# Patient Record
Sex: Female | Born: 1970 | Race: Black or African American | Hispanic: No | Marital: Married | Smoking: Never smoker
Health system: Southern US, Community
[De-identification: ages and names within clinical notes are randomized; demographics above are authoritative.]

---

## 2013-11-29 ENCOUNTER — Encounter (HOSPITAL_BASED_OUTPATIENT_CLINIC_OR_DEPARTMENT_OTHER): Payer: Self-pay | Admitting: Emergency Medicine

## 2013-11-29 ENCOUNTER — Emergency Department (HOSPITAL_BASED_OUTPATIENT_CLINIC_OR_DEPARTMENT_OTHER)
Admission: EM | Admit: 2013-11-29 | Discharge: 2013-11-30 | Disposition: A | Discharge status: Home / Self Care | Payer: 59 | Attending: Emergency Medicine | Admitting: Emergency Medicine

## 2013-11-29 DIAGNOSIS — Z3202 Encounter for pregnancy test, result negative: Secondary | ICD-10-CM | POA: Insufficient documentation

## 2013-11-29 DIAGNOSIS — B9789 Other viral agents as the cause of diseases classified elsewhere: Secondary | ICD-10-CM | POA: Insufficient documentation

## 2013-11-29 DIAGNOSIS — B349 Viral infection, unspecified: Secondary | ICD-10-CM

## 2013-11-29 LAB — URINALYSIS, ROUTINE W REFLEX MICROSCOPIC
BILIRUBIN URINE: NEGATIVE
Glucose, UA: NEGATIVE mg/dL
Hgb urine dipstick: NEGATIVE
Ketones, ur: 40 mg/dL — AB
NITRITE: NEGATIVE
Protein, ur: NEGATIVE mg/dL
SPECIFIC GRAVITY, URINE: 1.018 (ref 1.005–1.030)
UROBILINOGEN UA: 4 mg/dL — AB (ref 0.0–1.0)
pH: 8.5 — ABNORMAL HIGH (ref 5.0–8.0)

## 2013-11-29 LAB — URINE MICROSCOPIC-ADD ON

## 2013-11-29 LAB — PREGNANCY, URINE: Preg Test, Ur: NEGATIVE

## 2013-11-29 NOTE — ED Notes (Signed)
Abdominal pain and nausea. Leg pain.

## 2013-11-29 NOTE — ED Notes (Signed)
Slight ha x 1 day,  Lower abd pain radiating down left thigh down to knee,  nausea

## 2013-11-30 LAB — CBC WITH DIFFERENTIAL/PLATELET
BASOS PCT: 0 % (ref 0–1)
Basophils Absolute: 0 10*3/uL (ref 0.0–0.1)
Eosinophils Absolute: 0 10*3/uL (ref 0.0–0.7)
Eosinophils Relative: 0 % (ref 0–5)
HCT: 37.4 % (ref 36.0–46.0)
HEMOGLOBIN: 12.5 g/dL (ref 12.0–15.0)
LYMPHS ABS: 0.3 10*3/uL — AB (ref 0.7–4.0)
LYMPHS PCT: 5 % — AB (ref 12–46)
MCH: 34.4 pg — ABNORMAL HIGH (ref 26.0–34.0)
MCHC: 33.4 g/dL (ref 30.0–36.0)
MCV: 103 fL — ABNORMAL HIGH (ref 78.0–100.0)
MONOS PCT: 7 % (ref 3–12)
Monocytes Absolute: 0.5 10*3/uL (ref 0.1–1.0)
NEUTROS ABS: 5.9 10*3/uL (ref 1.7–7.7)
NEUTROS PCT: 88 % — AB (ref 43–77)
Platelets: 289 10*3/uL (ref 150–400)
RBC: 3.63 MIL/uL — ABNORMAL LOW (ref 3.87–5.11)
RDW: 12.1 % (ref 11.5–15.5)
WBC: 6.7 10*3/uL (ref 4.0–10.5)

## 2013-11-30 LAB — BASIC METABOLIC PANEL
BUN: 5 mg/dL — ABNORMAL LOW (ref 6–23)
CO2: 24 mEq/L (ref 19–32)
Calcium: 9.2 mg/dL (ref 8.4–10.5)
Chloride: 102 mEq/L (ref 96–112)
Creatinine, Ser: 0.7 mg/dL (ref 0.50–1.10)
Glucose, Bld: 120 mg/dL — ABNORMAL HIGH (ref 70–99)
POTASSIUM: 3.7 meq/L (ref 3.7–5.3)
SODIUM: 140 meq/L (ref 137–147)

## 2013-11-30 LAB — HEPATIC FUNCTION PANEL
ALBUMIN: 4 g/dL (ref 3.5–5.2)
ALK PHOS: 80 U/L (ref 39–117)
ALT: 15 U/L (ref 0–35)
AST: 26 U/L (ref 0–37)
BILIRUBIN TOTAL: 0.6 mg/dL (ref 0.3–1.2)
Bilirubin, Direct: 0.2 mg/dL (ref 0.0–0.3)
Indirect Bilirubin: 0.4 mg/dL (ref 0.3–0.9)
Total Protein: 7.3 g/dL (ref 6.0–8.3)

## 2013-11-30 LAB — CK: Total CK: 76 U/L (ref 7–177)

## 2013-11-30 MED ORDER — KETOROLAC TROMETHAMINE 30 MG/ML IJ SOLN
30.0000 mg | Freq: Once | INTRAMUSCULAR | Status: AC
  Administered 2013-11-30: 30 mg via INTRAVENOUS
  Filled 2013-11-30: qty 2
  Filled 2013-11-30: qty 1

## 2013-11-30 MED ORDER — SODIUM CHLORIDE 0.9 % IV BOLUS (SEPSIS)
1000.0000 mL | Freq: Once | INTRAVENOUS | Status: AC
  Administered 2013-11-30: 1000 mL via INTRAVENOUS

## 2013-11-30 MED ORDER — ONDANSETRON HCL 4 MG/2ML IJ SOLN
4.0000 mg | Freq: Once | INTRAMUSCULAR | Status: AC
  Administered 2013-11-30: 4 mg via INTRAVENOUS
  Filled 2013-11-30: qty 2

## 2013-11-30 NOTE — ED Provider Notes (Signed)
CSN: [id number]     Arrival date & time 11/29/13  2229 History   First MD Initiated Contact with Patient 11/30/13 0022     Chief Complaint  Patient presents with  . Feels Bad      (Consider location/radiation/quality/duration/timing/severity/associated sxs/prior Treatment) HPI This is a 43 year old female who states she has "felt bad" since yesterday morning. The onset was gradual. By feeling bad she describes this as general malaise, myalgias particularly of the thighs and knees, nausea and a vague discomfort in her abdomen. She was noted to have a low-grade fever on arrival. She has not vomited or had diarrhea. She denies respiratory symptoms including chest pain, cough or shortness of breath. Her abdomen is not tender.  History reviewed. No pertinent past medical history. History reviewed. No pertinent past surgical history. No family history on file. History  Substance Use Topics  . Smoking status: Never Smoker   . Smokeless tobacco: Not on file  . Alcohol Use: No   OB History   Grav Para Term Preterm Abortions TAB SAB Ect Mult Living                 Review of Systems  All other systems reviewed and are negative.    Allergies  Review of patient's allergies indicates no known allergies.  Home Medications  No current outpatient prescriptions on file. BP 115/70  Pulse 115  Temp(Src) 99.1 F (37.3 C) (Oral)  Resp 20  Ht 5\' 2"  (1.575 m)  Wt 118 lb (53.524 kg)  BMI 21.58 kg/m2  SpO2 96%  LMP 11/13/2013  Physical Exam General: Well-developed, well-nourished female in no acute distress; appearance consistent with age of record HENT: normocephalic; atraumatic Eyes: pupils equal, round and reactive to light; extraocular muscles intact; no scleral icterus Neck: supple Heart: regular rate and rhythm; no murmurs, rubs or gallops Lungs: clear to auscultation bilaterally Abdomen: soft; nondistended; nontender; no masses or hepatosplenomegaly; bowel sounds  present Extremities: No deformity; full range of motion; pulses normal; mild tenderness of the distal quadriceps Neurologic: Awake, alert and oriented; motor function intact in all extremities and symmetric; no facial droop Skin: Warm and dry Psychiatric: Flat affect    ED Course  Procedures (including critical care time)   MDM   Nursing notes and vitals signs, including pulse oximetry, reviewed.  Summary of this visit's results, reviewed by myself:  Labs:  Results for orders placed during the hospital encounter of 11/29/13 (from the past 24 hour(s))  URINALYSIS, ROUTINE W REFLEX MICROSCOPIC     Status: Abnormal   Collection Time    11/29/13 10:30 PM      Result Value Ref Range   Color, Urine YELLOW  YELLOW   APPearance CLOUDY (*) CLEAR   Specific Gravity, Urine 1.018  1.005 - 1.030   pH 8.5 (*) 5.0 - 8.0   Glucose, UA NEGATIVE  NEGATIVE mg/dL   Hgb urine dipstick NEGATIVE  NEGATIVE   Bilirubin Urine NEGATIVE  NEGATIVE   Ketones, ur 40 (*) NEGATIVE mg/dL   Protein, ur NEGATIVE  NEGATIVE mg/dL   Urobilinogen, UA 4.0 (*) 0.0 - 1.0 mg/dL   Nitrite NEGATIVE  NEGATIVE   Leukocytes, UA TRACE (*) NEGATIVE  PREGNANCY, URINE     Status: None   Collection Time    11/29/13 10:30 PM      Result Value Ref Range   Preg Test, Ur NEGATIVE  NEGATIVE  URINE MICROSCOPIC-ADD ON     Status: Abnormal   Collection Time  11/29/13 10:30 PM      Result Value Ref Range   Squamous Epithelial / LPF MANY (*) RARE   WBC, UA 0-2  <3 WBC/hpf   RBC / HPF 0-2  <3 RBC/hpf   Bacteria, UA FEW (*) RARE   Urine-Other MUCOUS PRESENT    CK     Status: None   Collection Time    11/30/13 12:25 AM      Result Value Ref Range   Total CK 76  7 - 177 U/L  BASIC METABOLIC PANEL     Status: Abnormal   Collection Time    11/30/13 12:25 AM      Result Value Ref Range   Sodium 140  137 - 147 mEq/L   Potassium 3.7  3.7 - 5.3 mEq/L   Chloride 102  96 - 112 mEq/L   CO2 24  19 - 32 mEq/L   Glucose, Bld 120  (*) 70 - 99 mg/dL   BUN 5 (*) 6 - 23 mg/dL   Creatinine, Ser 0.70  0.50 - 1.10 mg/dL   Calcium 9.2  8.4 - 10.5 mg/dL   GFR calc non Af Amer >90  >90 mL/min   GFR calc Af Amer >90  >90 mL/min  CBC WITH DIFFERENTIAL     Status: Abnormal   Collection Time    11/30/13 12:25 AM      Result Value Ref Range   WBC 6.7  4.0 - 10.5 K/uL   RBC 3.63 (*) 3.87 - 5.11 MIL/uL   Hemoglobin 12.5  12.0 - 15.0 g/dL   HCT 37.4  36.0 - 46.0 %   MCV 103.0 (*) 78.0 - 100.0 fL   MCH 34.4 (*) 26.0 - 34.0 pg   MCHC 33.4  30.0 - 36.0 g/dL   RDW 12.1  11.5 - 15.5 %   Platelets 289  150 - 400 K/uL   Neutrophils Relative % 88 (*) 43 - 77 %   Neutro Abs 5.9  1.7 - 7.7 K/uL   Lymphocytes Relative 5 (*) 12 - 46 %   Lymphs Abs 0.3 (*) 0.7 - 4.0 K/uL   Monocytes Relative 7  3 - 12 %   Monocytes Absolute 0.5  0.1 - 1.0 K/uL   Eosinophils Relative 0  0 - 5 %   Eosinophils Absolute 0.0  0.0 - 0.7 K/uL   Basophils Relative 0  0 - 1 %   Basophils Absolute 0.0  0.0 - 0.1 K/uL  HEPATIC FUNCTION PANEL     Status: None   Collection Time    11/30/13 12:25 AM      Result Value Ref Range   Total Protein 7.3  6.0 - 8.3 g/dL   Albumin 4.0  3.5 - 5.2 g/dL   AST 26  0 - 37 U/L   ALT 15  0 - 35 U/L   Alkaline Phosphatase 80  39 - 117 U/L   Total Bilirubin 0.6  0.3 - 1.2 mg/dL   Bilirubin, Direct 0.2  0.0 - 0.3 mg/dL   Indirect Bilirubin 0.4  0.3 - 0.9 mg/dL   1:27 AM Feels better after IV fluids and medications. The symptoms are likely due to an acute viral illness.     Wynetta Fines, MD 11/30/13 [phone]

## 2014-08-07 ENCOUNTER — Other Ambulatory Visit: Payer: Self-pay | Admitting: Obstetrics and Gynecology

## 2014-08-07 DIAGNOSIS — N63 Unspecified lump in unspecified breast: Secondary | ICD-10-CM

## 2014-08-17 ENCOUNTER — Other Ambulatory Visit: Payer: Self-pay | Admitting: Obstetrics and Gynecology

## 2014-08-17 ENCOUNTER — Other Ambulatory Visit: Payer: Self-pay

## 2014-08-17 DIAGNOSIS — N63 Unspecified lump in unspecified breast: Secondary | ICD-10-CM

## 2014-08-22 ENCOUNTER — Ambulatory Visit
Admission: RE | Admit: 2014-08-22 | Discharge: 2014-08-22 | Disposition: A | Discharge status: Home / Self Care | Payer: 59 | Source: Ambulatory Visit | Attending: Obstetrics and Gynecology | Admitting: Obstetrics and Gynecology

## 2014-08-22 ENCOUNTER — Other Ambulatory Visit: Payer: Self-pay | Admitting: Obstetrics and Gynecology

## 2014-08-22 DIAGNOSIS — N63 Unspecified lump in unspecified breast: Secondary | ICD-10-CM

## 2014-08-28 ENCOUNTER — Other Ambulatory Visit: Payer: Self-pay | Admitting: Obstetrics and Gynecology

## 2014-08-28 DIAGNOSIS — N63 Unspecified lump in unspecified breast: Secondary | ICD-10-CM

## 2014-09-01 ENCOUNTER — Ambulatory Visit
Admission: RE | Admit: 2014-09-01 | Discharge: 2014-09-01 | Disposition: A | Discharge status: Home / Self Care | Payer: 59 | Source: Ambulatory Visit | Attending: Obstetrics and Gynecology | Admitting: Obstetrics and Gynecology

## 2014-09-01 DIAGNOSIS — N63 Unspecified lump in unspecified breast: Secondary | ICD-10-CM

## 2015-02-01 ENCOUNTER — Other Ambulatory Visit: Payer: Self-pay | Admitting: Obstetrics & Gynecology

## 2015-02-02 LAB — CYTOLOGY - PAP

## 2015-02-26 ENCOUNTER — Other Ambulatory Visit: Payer: Self-pay | Admitting: General Surgery

## 2015-02-26 DIAGNOSIS — D241 Benign neoplasm of right breast: Secondary | ICD-10-CM

## 2015-09-12 IMAGING — US US RT BREAST BX W LOC DEV 1ST LESION IMG BX SPEC US GUIDE
1 series · 12 of 12 positions shown · non-contrast
Comparison: Previous exams.

ADDENDUM:
Pathology revealed a ductal papilloma in the right breast, for which
excision is recommended. This was found to be concordant by Kaki. Neupane
Jorje. Pathology was discussed with the patient by telephone. She
reported doing well after the biopsy. Post biopsy instructions and
care were reviewed and her questions were answered. Surgical
consultation has been arranged with Kaki. Lilia Guadalupe Axel at [[email or web address]] on September 22, 2014.

Pathology results reported by Laty Sino RN, BSN on September 05, 2014.
CLINICAL DATA: 43-year-old female with indeterminate right breast
mass at 230
EXAM:
ULTRASOUND GUIDED RIGHT BREAST CORE NEEDLE BIOPSY

[Series 1: us right breast bx w loc dev 1st lesion img bx spe · 12 of 12 slices shown]
[im 1/12]
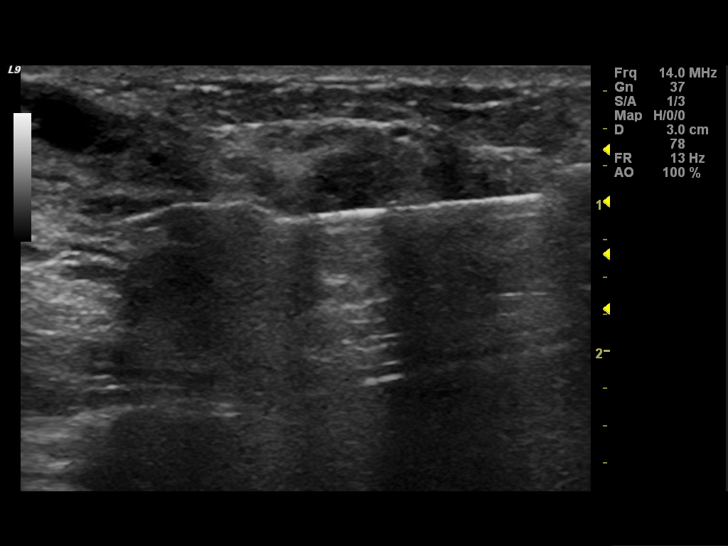
[im 2/12]
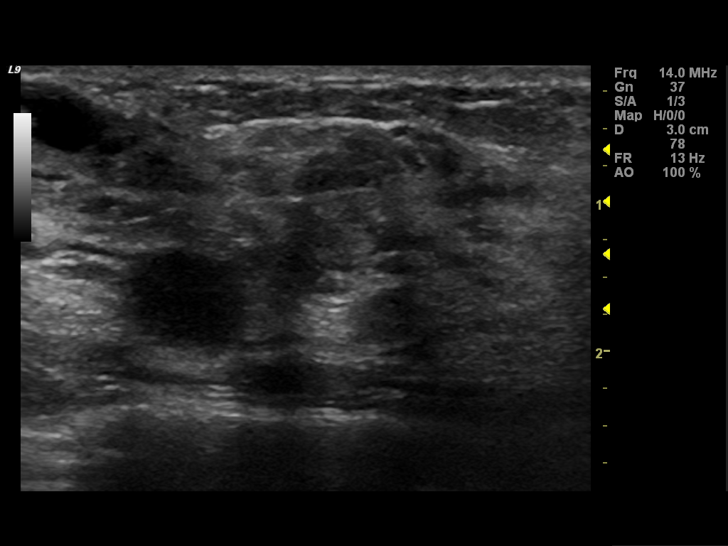
[im 3/12]
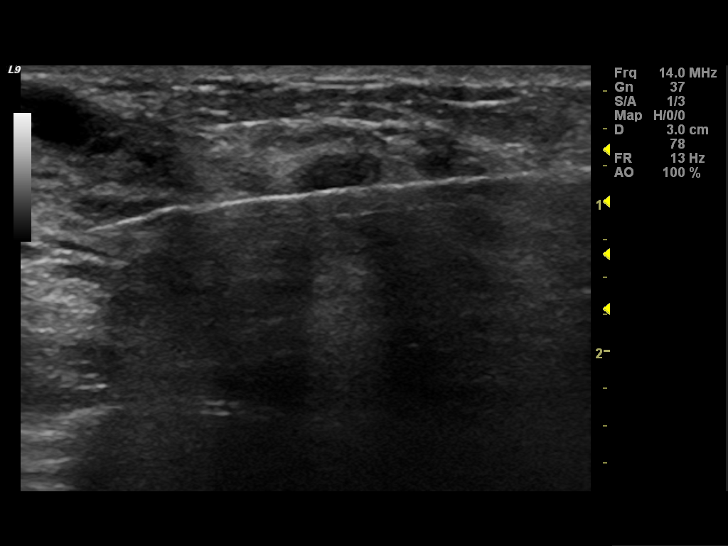
[im 4/12]
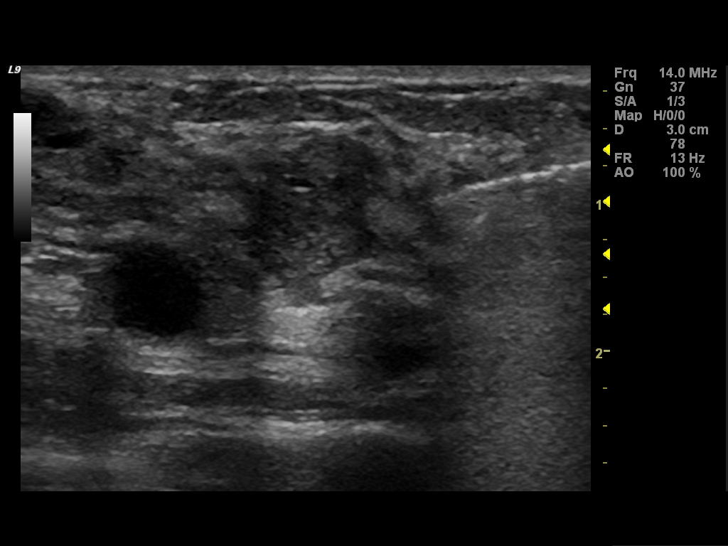
[im 5/12]
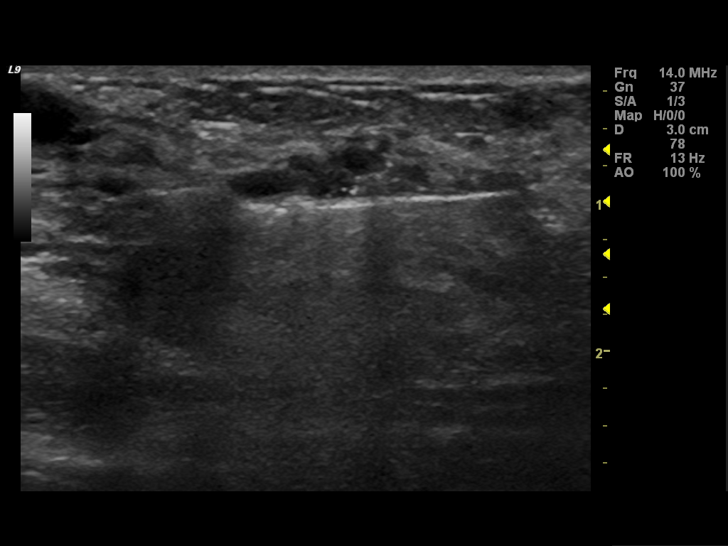
[im 6/12]
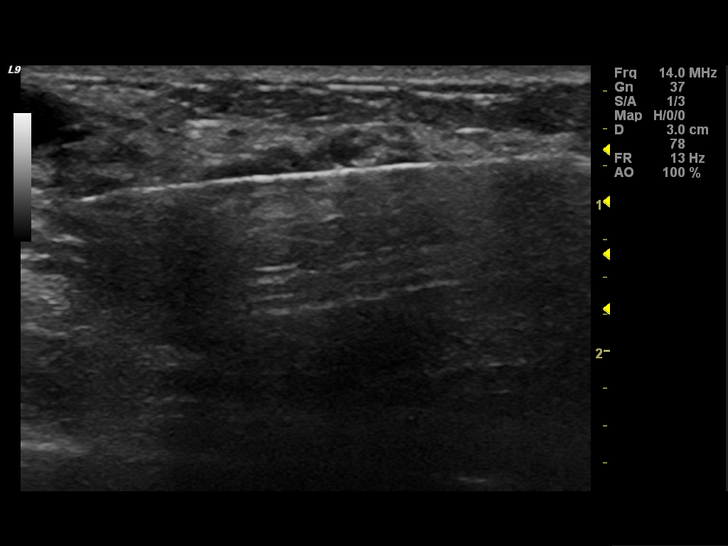
[im 7/12]
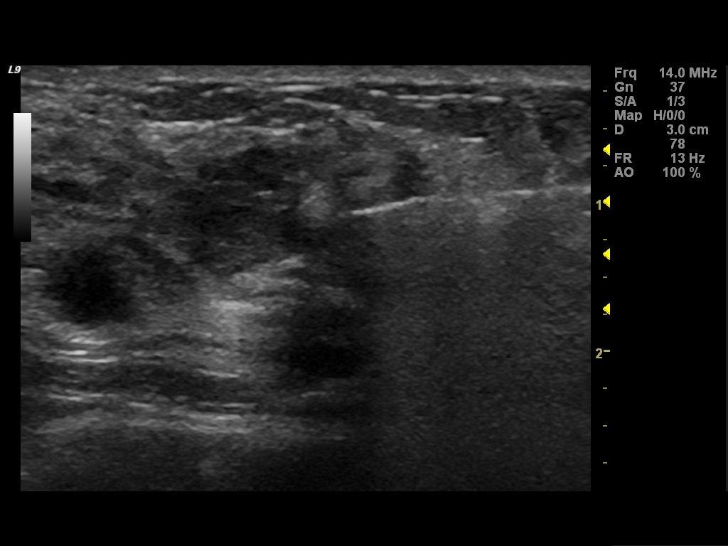
[im 8/12]
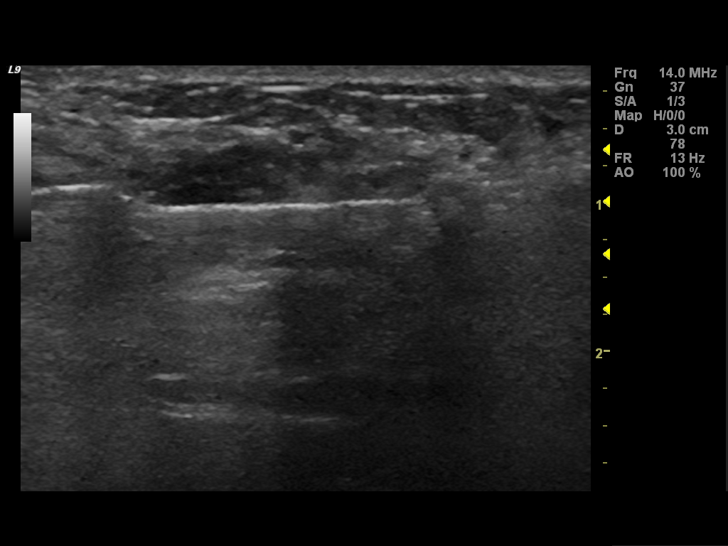
[im 9/12]
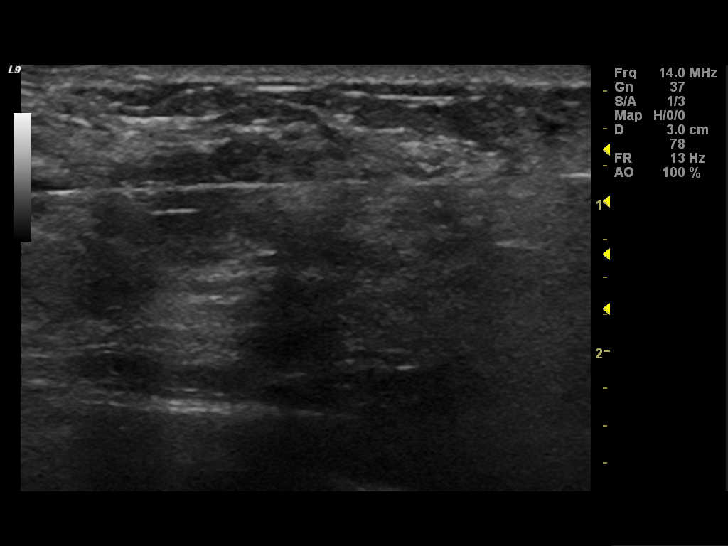
[im 10/12]
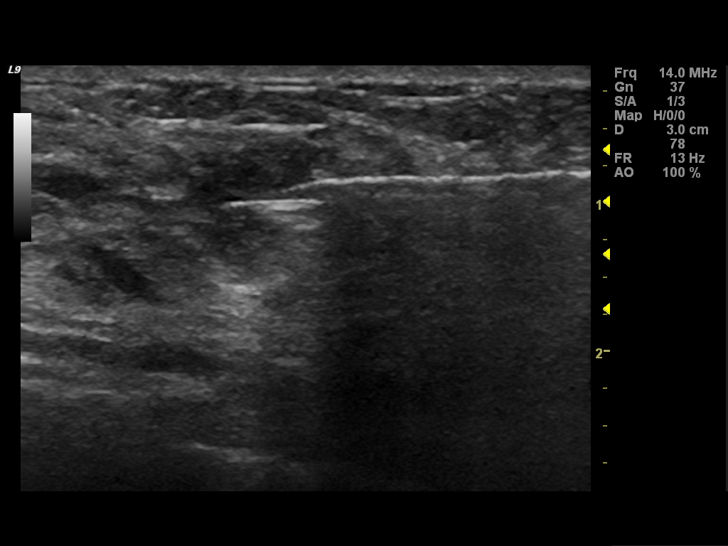
[im 11/12]
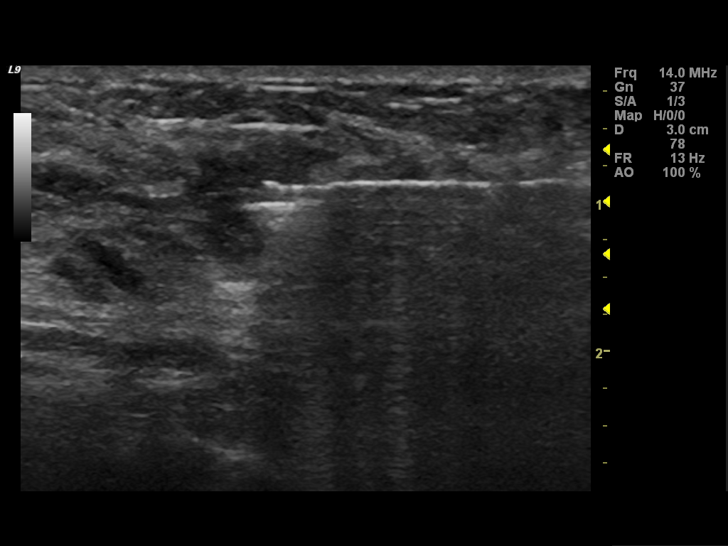
[im 12/12]
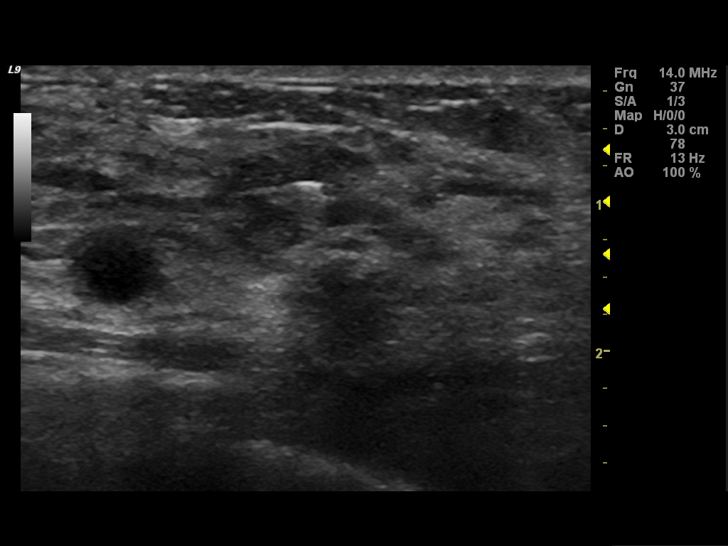

[12 of 12 positions shown; findings below may reference images not displayed]

PROCEDURE:
I met with the patient and we discussed the procedure of
ultrasound-guided biopsy, including benefits and alternatives. We
discussed the high likelihood of a successful procedure. We
discussed the risks of the procedure including infection, bleeding,
tissue injury, clip migration, and inadequate sampling. Informed
written consent was given. The usual time-out protocol was performed
immediately prior to the procedure.

Using sterile technique and 2% Lidocaine as local anesthetic, under
direct ultrasound visualization, a 12 gauge vacuum-assisteddevice
was used to perform biopsy of an indeterminate right breast mass at
230 using a medial approach. At the conclusion of the procedure, a
heart tissue marker clip was deployed into the biopsy cavity.
Follow-up 2-view mammogram was performed and dictated separately.
IMPRESSION: Ultrasound-guided biopsy of an indeterminate right breast mass at
230. No apparent complications.

## 2015-09-12 IMAGING — MG MM DIGITAL DIAGNOSTIC UNILAT*R*
2 series · 2 of 2 positions shown · non-contrast
Comparison: Previous exams

CLINICAL DATA: 43-year-old female status post ultrasound-guided
right breast biopsy

EXAM:
DIAGNOSTIC RIGHT MAMMOGRAM POST ULTRASOUND BIOPSY

[R CC]
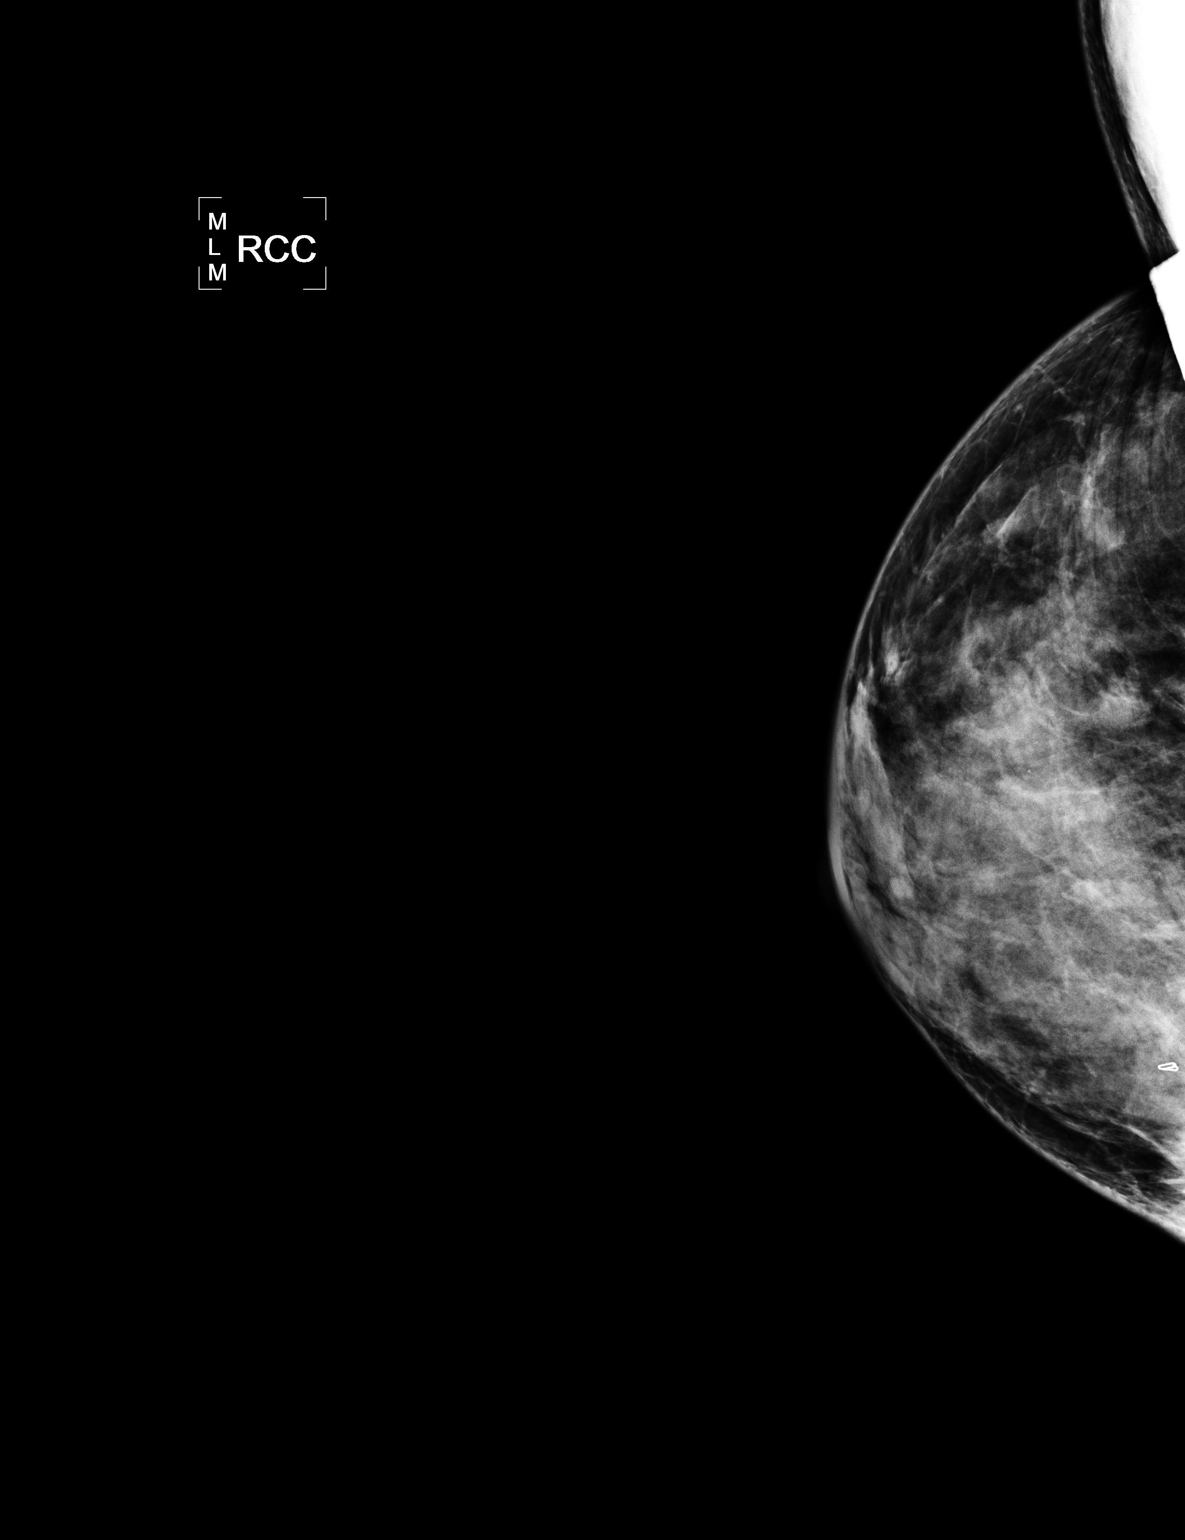

[R ML]
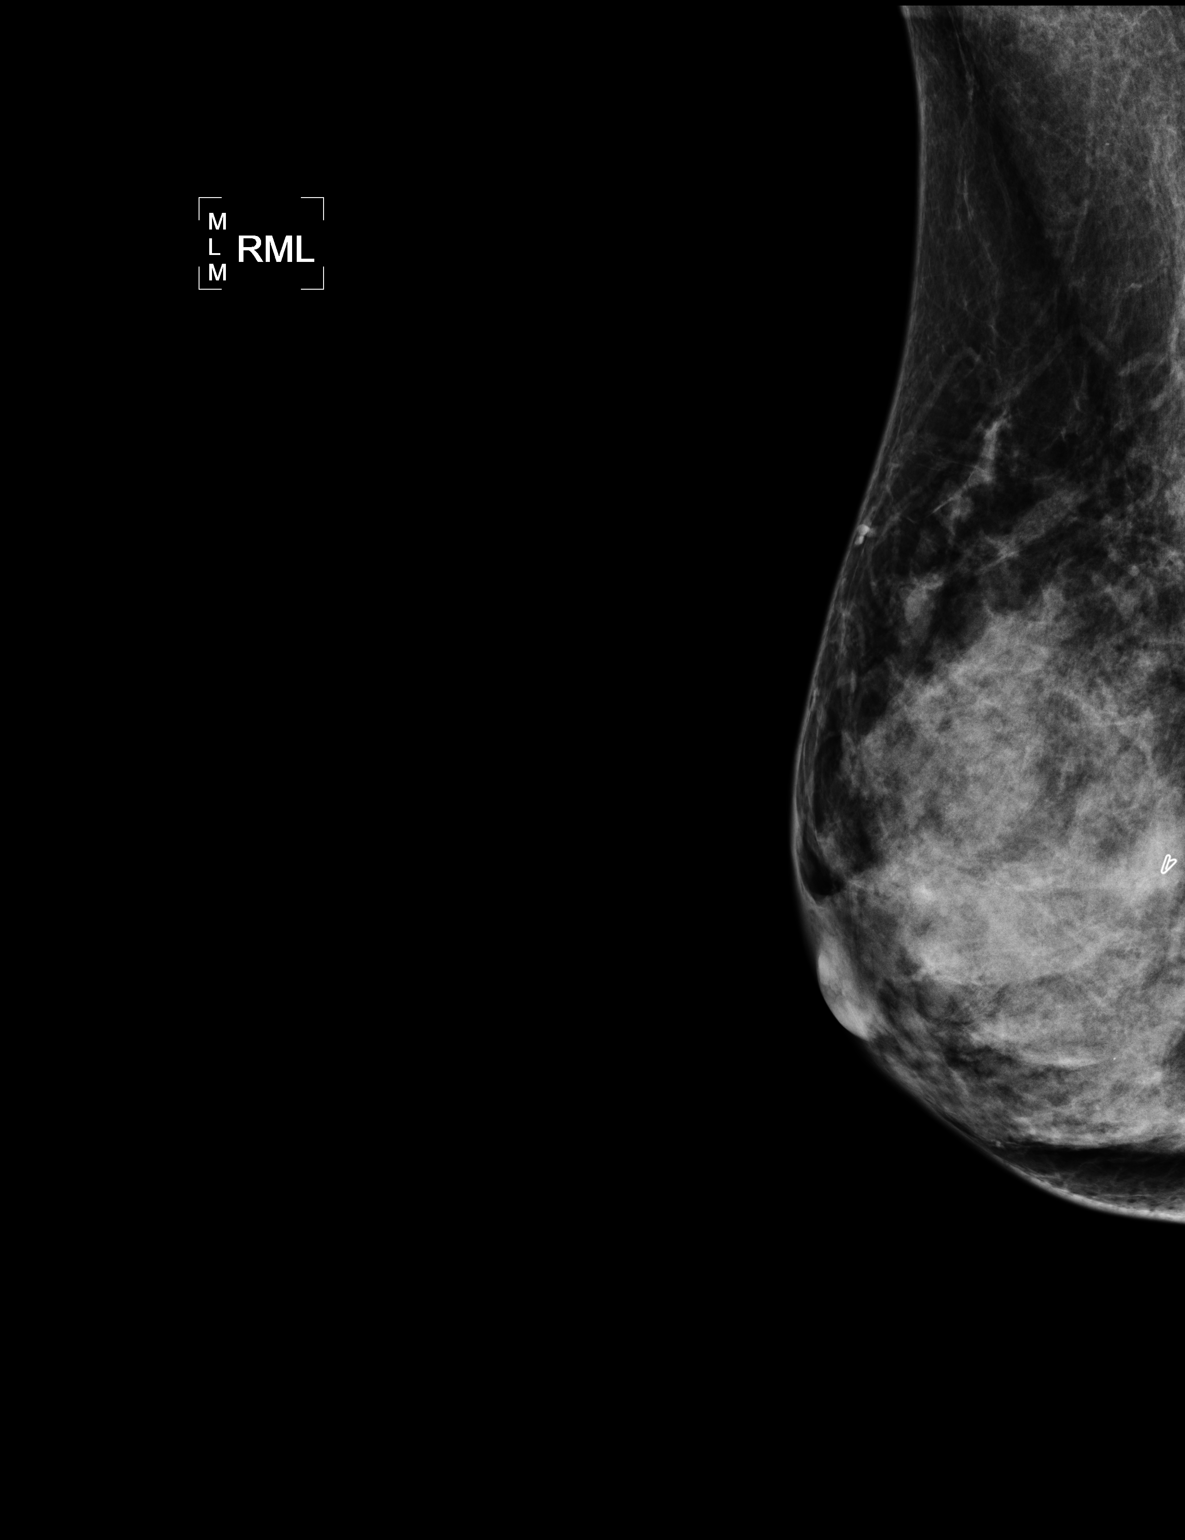

[2 of 2 positions shown; findings below may reference images not displayed]

FINDINGS: Mammographic images were obtained following ultrasound guided biopsy
of an indeterminate right breast mass at 230. Post biopsy images
demonstrate a heart shaped clip to be in the expected position
within the upper, inner right breast.
IMPRESSION: Satisfactory clip placement post ultrasound-guided right breast
biopsy.

Final Assessment: Post Procedure Mammograms for Marker Placement

## 2015-11-13 ENCOUNTER — Encounter (HOSPITAL_BASED_OUTPATIENT_CLINIC_OR_DEPARTMENT_OTHER): Payer: Self-pay

## 2015-11-13 ENCOUNTER — Emergency Department (HOSPITAL_BASED_OUTPATIENT_CLINIC_OR_DEPARTMENT_OTHER): Payer: 59

## 2015-11-13 DIAGNOSIS — M25562 Pain in left knee: Secondary | ICD-10-CM | POA: Diagnosis present

## 2015-11-13 DIAGNOSIS — M7989 Other specified soft tissue disorders: Secondary | ICD-10-CM | POA: Diagnosis not present

## 2015-11-13 NOTE — ED Notes (Signed)
Sudden onset left knee pain tonight, no injury, no swelling or trauma appreciated upon exam, pt able to ambulate with small limp

## 2015-11-14 ENCOUNTER — Encounter: Payer: Self-pay | Admitting: Family Medicine

## 2015-11-14 ENCOUNTER — Ambulatory Visit (INDEPENDENT_AMBULATORY_CARE_PROVIDER_SITE_OTHER): Payer: 59 | Admitting: Family Medicine

## 2015-11-14 ENCOUNTER — Emergency Department (HOSPITAL_BASED_OUTPATIENT_CLINIC_OR_DEPARTMENT_OTHER)
Admission: EM | Admit: 2015-11-14 | Discharge: 2015-11-14 | Disposition: A | Discharge status: Home / Self Care | Payer: 59 | Attending: Emergency Medicine | Admitting: Emergency Medicine

## 2015-11-14 VITALS — BP 115/74 | HR 89 | Ht 63.0 in | Wt 115.0 lb

## 2015-11-14 DIAGNOSIS — M25562 Pain in left knee: Secondary | ICD-10-CM

## 2015-11-14 DIAGNOSIS — M79605 Pain in left leg: Secondary | ICD-10-CM

## 2015-11-14 MED ORDER — IBUPROFEN 800 MG PO TABS: 800.0000 mg | ORAL_TABLET | Freq: Three times a day (TID) | ORAL | Status: AC | PRN

## 2015-11-14 NOTE — ED Provider Notes (Signed)
TIME SEEN: 2:40 AM  CHIEF COMPLAINT: Left knee pain  HPI: Pt is a 45 y.o. female with no significant past medical history who presents to the emergency department with left knee pain that started last night. Reports started after prolonged episode of sitting. No history of injury that she is aware of. States that pain is anterior and she noticed some swelling in this area that is now gone. pain or tenderness in the popliteal area. She has not had previous issues with pain in this knee. No history of any surgery or intra-articular injections. No history of gout. No fever. No numbness or focal weakness. Is able to ambulate but does so with a limp. Take 400 mg of ibuprofen at home with good relief of pain. States pain is worse with ambulation.  ROS: See HPI Constitutional: no fever  Eyes: no drainage  ENT: no runny nose   Cardiovascular:  no chest pain  Resp: no SOB  GI: no vomiting GU: no dysuria Integumentary: no rash  Allergy: no hives  Musculoskeletal: no leg swelling  Neurological: no slurred speech ROS otherwise negative  PAST MEDICAL HISTORY/PAST SURGICAL HISTORY:  History reviewed. No pertinent past medical history.  MEDICATIONS:  Prior to Admission medications   Not on File    ALLERGIES:  No Known Allergies  SOCIAL HISTORY:  Social History  Substance Use Topics  . Smoking status: Never Smoker   . Smokeless tobacco: Not on file  . Alcohol Use: No    FAMILY HISTORY: No family history on file.  EXAM: BP 114/70 mmHg  Pulse 88  Temp(Src) 98.7 F (37.1 C) (Oral)  Resp 18  Ht 5\' 2"  (1.575 m)  Wt 115 lb 12.8 oz (52.527 kg)  BMI 21.17 kg/m2  SpO2 99%  LMP 10/30/2015 CONSTITUTIONAL: Alert and oriented and responds appropriately to questions. Well-appearing; well-nourished HEAD: Normocephalic EYES: Conjunctivae clear, PERRL ENT: normal nose; no rhinorrhea; moist mucous membranes NECK: Supple, no meningismus, no LAD  CARD: RRR; S1 and S2 appreciated; no murmurs, no  clicks, no rubs, no gallops RESP: Normal chest excursion without splinting or tachypnea; breath sounds clear and equal bilaterally; no wheezes, no rhonchi, no rales, no hypoxia or respiratory distress, speaking full sentences ABD/GI: Normal bowel sounds; non-distended; soft, non-tender, no rebound, no guarding, no peritoneal signs BACK:  The back appears normal and is non-tender to palpation, there is no CVA tenderness EXT: Patient is tender to palpation over the lateral and medial joint line of the left knee. There is no significant joint effusion. There is no erythema or warmth. No ligamentous laxity appreciated. 2+ DP pulse on the left side. No calf tenderness or swelling appreciated. No tenderness over the left hip or left ankle or left toes. She has normal range of motion in the left knee but has pain with flexion. Normal ROM in all joints; otherwise extremity is on her non-tender to palpation; no edema; normal capillary refill; no cyanosis, no calf tenderness or swelling    SKIN: Normal color for age and race; warm; no rash NEURO: Moves all extremities equally, sensation to light touch intact diffusely, cranial nerves II through XII intact, ambulates with a slight limp PSYCH: The patient's mood and manner are appropriate. Grooming and personal hygiene are appropriate.  MEDICAL DECISION MAKING: Patient here with left knee pain. No sign of gout, septic arthritis on exam. No history of injury and no ligamentous laxity. No history of autoimmune disease. She is not immunocompromised. X-ray shows no fracture dislocation with normal joint  space. Discussed with patient that this could be tendinitis, less likely arthritis, meniscal injury. Discussed with her that this would all be treated the same with rest, elevation, ice and anti-inflammatories. Have offered her Vicodin or Percocet for pain control which he declines. Have offered her crutches which she declines. Have recommended ibuprofen 800 mg every 8  hours for pain as well as elevation and ice. We'll give her outpatient orthopedic follow-up information if symptoms continue despite medical management. Discussed return precautions. She verbalizes understanding and is comfortable with this plan.      [place], DO 11/14/15 [phone]

## 2015-11-14 NOTE — Discharge Instructions (Signed)
Knee Pain Knee pain is a very common symptom and can have many causes. Knee pain often goes away when you follow your health care provider's instructions for relieving pain and discomfort at home. However, knee pain can develop into a condition that needs treatment. Some conditions may include:  Arthritis caused by wear and tear (osteoarthritis).  Arthritis caused by swelling and irritation (rheumatoid arthritis or gout).  A cyst or growth in your knee.  An infection in your knee joint.  An injury that will not heal.  Damage, swelling, or irritation of the tissues that support your knee (torn ligaments or tendinitis). If your knee pain continues, additional tests may be ordered to diagnose your condition. Tests may include X-rays or other imaging studies of your knee. You may also need to have fluid removed from your knee. Treatment for ongoing knee pain depends on the cause, but treatment may include:  Medicines to relieve pain or swelling.  Steroid injections in your knee.  Physical therapy.  Surgery. HOME CARE INSTRUCTIONS  Take medicines only as directed by your health care provider.  Rest your knee and keep it raised (elevated) while you are resting.  Do not do things that cause or worsen pain.  Avoid high-impact activities or exercises, such as running, jumping rope, or doing jumping jacks.  Apply ice to the knee area:  Put ice in a plastic bag.  Place a towel between your skin and the bag.  Leave the ice on for 20 minutes, 2-3 times a day.  Ask your health care provider if you should wear an elastic knee support.  Keep a pillow under your knee when you sleep.  Lose weight if you are overweight. Extra weight can put pressure on your knee.  Do not use any tobacco products, including cigarettes, chewing tobacco, or electronic cigarettes. If you need help quitting, ask your health care provider. Smoking may slow the healing of any bone and joint problems that you may  have. SEEK MEDICAL CARE IF:  Your knee pain continues, changes, or gets worse.  You have a fever along with knee pain.  Your knee buckles or locks up.  Your knee becomes more swollen. SEEK IMMEDIATE MEDICAL CARE IF:   Your knee joint feels hot to the touch.  You have chest pain or trouble breathing.   This information is not intended to replace advice given to you by your health care provider. Make sure you discuss any questions you have with your health care provider.   Document Released: 06/29/2007 Document Revised: 09/22/2014 Document Reviewed: 04/17/2014 Elsevier Interactive Patient Education 2016 [employer] for Routine Care of Injuries Theroutine careofmanyinjuriesincludes rest, ice, compression, and elevation (RICE therapy). RICE therapy is often recommended for injuries to soft tissues, such as a muscle strain, ligament injuries, bruises, and overuse injuries. It can also be used for some bony injuries. Using RICE therapy can help to relieve pain, lessen swelling, and enable your body to heal. Rest Rest is required to allow your body to heal. This usually involves reducing your normal activities and avoiding use of the injured part of your body. Generally, you can return to your normal activities when you are comfortable and have been given permission by your health care provider. Ice Icing your injury helps to keep the swelling down, and it lessens pain. Do not apply ice directly to your skin.  Put ice in a plastic bag.  Place a towel between your skin and the bag.  Leave  the ice on for 20 minutes, 2-3 times a day. Do this for as long as you are directed by your health care provider. Compression Compression means putting pressure on the injured area. Compression helps to keep swelling down, gives support, and helps with discomfort. Compression may be done with an elastic bandage. If an elastic bandage has been applied, follow these general tips:  Remove  and reapply the bandage every 3-4 hours or as directed by your health care provider.  Make sure the bandage is not wrapped too tightly, because this can cut off circulation. If part of your body beyond the bandage becomes blue, numb, cold, swollen, or more painful, your bandage is most likely too tight. If this occurs, remove your bandage and reapply it more loosely.  See your health care provider if the bandage seems to be making your problems worse rather than better. Elevation Elevation means keeping the injured area raised. This helps to lessen swelling and decrease pain. If possible, your injured area should be elevated at or above the level of your heart or the center of your chest. Lyman? You should seek medical care if:  Your pain and swelling continue.  Your symptoms are getting worse rather than improving. These symptoms may indicate that further evaluation or further X-rays are needed. Sometimes, X-rays may not show a small broken bone (fracture) until a number of days later. Make a follow-up appointment with your health care provider. WHEN SHOULD I SEEK IMMEDIATE MEDICAL CARE? You should seek immediate medical care if:  You have sudden severe pain at or below the area of your injury.  You have redness or increased swelling around your injury.  You have tingling or numbness at or below the area of your injury that does not improve after you remove the elastic bandage.   This information is not intended to replace advice given to you by your health care provider. Make sure you discuss any questions you have with your health care provider.   Document Released: 12/14/2000 Document Revised: 05/23/2015 Document Reviewed: 08/09/2014 Elsevier Interactive Patient Education [employer].

## 2015-11-14 NOTE — Patient Instructions (Addendum)
Your history and exam suggest an irritated nerve from your back accounting for this level of pain and distribution down into your knee. Consider prednisone dose pack - call if you want to try this. Ibuprofen 600mg  three times a day with food OR aleve 2 tabs twice a day with food for pain and inflammation as needed. Consider muscle relaxant, pain medication if needed. Call me if you want to do physical therapy as well in addition to these home exercises.   Strengthening of low back muscles, abdominal musculature are key for long term pain relief. If not improving, will consider further imaging as well. Follow up with me in 4 weeks for reevaluation.

## 2015-11-19 ENCOUNTER — Encounter: Payer: Self-pay | Admitting: Family Medicine

## 2015-11-19 NOTE — Progress Notes (Signed)
PCP: No PCP Per Patient  Subjective:   HPI: Patient is a 45 y.o. female here for left leg pain.  Patient denies known injury or trauma. She states yesterday 2/28 she was just sitting on the couch when she started to get pain that seemed to start in left knee. Then radiates up to buttocks and down to left foot. Pain was excruciating, sharp, to 10/10 level. Has improved since then and down to 0/10 currently. Feels like a mild discomfort now. Improved with ibuprofen. She does report problems with her gait and this seemed to stem from epidural following her last pregnancy. No skin changes, fever, other complaints.  No past medical history on file.  Current Outpatient Prescriptions on File Prior to Visit  Medication Sig Dispense Refill  . ibuprofen (ADVIL,MOTRIN) 800 MG tablet Take 1 tablet (800 mg total) by mouth every 8 (eight) hours as needed for mild pain. 30 tablet 0   No current facility-administered medications on file prior to visit.    No past surgical history on file.  No Known Allergies  Social History   Social History  . Marital Status: Married    Spouse Name: N/A  . Number of Children: N/A  . Years of Education: N/A   Occupational History  . Not on file.   Social History Main Topics  . Smoking status: Never Smoker   . Smokeless tobacco: Not on file  . Alcohol Use: No  . Drug Use: No  . Sexual Activity: Not on file   Other Topics Concern  . Not on file   Social History Narrative    No family history on file.  BP 115/74 mmHg  Pulse 89  Ht 5\' 3"  (1.6 m)  Wt 115 lb (52.164 kg)  BMI 20.38 kg/m2  LMP 10/30/2015  Review of Systems: See HPI above.    Objective:  Physical Exam:  Gen: NAD, comfortable in exam room  Back: No gross deformity, scoliosis. No TTP .  No midline or bony TTP. FROM. Strength LEs 5/5 all muscle groups.   2+ MSRs in patellar and achilles tendons, equal bilaterally. Negative SLRs. Sensation intact to light touch  bilaterally.  Left hip: Negative logroll Negative fabers and piriformis stretches.  Left knee: No gross deformity, ecchymoses, swelling. No TTP. FROM. Negative ant/post drawers. Negative valgus/varus testing. Negative lachmanns. Negative mcmurrays, apleys, patellar apprehension. NV intact distally.    Assessment & Plan:  1. Left leg pain - distribution throughout the leg with high severity without injury suggests radiculopathy that was transient.  Independently reviewed radiographs of left knee - these are normal, reassuring.  She will take nsaids and do home exercises that were reviewed today.  She will consider prednisone, muscle relaxant, physical therapy if pain recurs.  F/u in 4 weeks for reevaluation.

## 2015-11-20 DIAGNOSIS — M79605 Pain in left leg: Secondary | ICD-10-CM | POA: Insufficient documentation

## 2015-11-20 NOTE — Assessment & Plan Note (Signed)
distribution throughout the leg with high severity without injury suggests radiculopathy that was transient.  Independently reviewed radiographs of left knee - these are normal, reassuring.  She will take nsaids and do home exercises that were reviewed today.  She will consider prednisone, muscle relaxant, physical therapy if pain recurs.  F/u in 4 weeks for reevaluation.

## 2016-03-17 ENCOUNTER — Other Ambulatory Visit: Payer: Self-pay | Admitting: Obstetrics & Gynecology

## 2016-03-17 DIAGNOSIS — R928 Other abnormal and inconclusive findings on diagnostic imaging of breast: Secondary | ICD-10-CM

## 2016-03-25 ENCOUNTER — Ambulatory Visit
Admission: RE | Admit: 2016-03-25 | Discharge: 2016-03-25 | Disposition: A | Discharge status: Home / Self Care | Payer: 59 | Source: Ambulatory Visit | Attending: Obstetrics & Gynecology | Admitting: Obstetrics & Gynecology

## 2016-03-25 DIAGNOSIS — R928 Other abnormal and inconclusive findings on diagnostic imaging of breast: Secondary | ICD-10-CM
